# Patient Record
Sex: Female | Born: 1990 | Race: Black or African American | Hispanic: No | Marital: Single | Smoking: Never smoker
Health system: Southern US, Community
[De-identification: ages and names within clinical notes are randomized; demographics above are authoritative.]

## PROBLEM LIST (undated history)

## (undated) HISTORY — PX: OTHER SURGICAL HISTORY: SHX169

---
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
## 2012-05-22 ENCOUNTER — Emergency Department (HOSPITAL_COMMUNITY)
Admission: EM | Admit: 2012-05-22 | Discharge: 2012-05-22 | Disposition: A | Discharge status: Home / Self Care | Payer: Self-pay | Attending: Emergency Medicine | Admitting: Emergency Medicine

## 2012-05-22 ENCOUNTER — Encounter (HOSPITAL_COMMUNITY): Payer: Self-pay | Admitting: Emergency Medicine

## 2012-05-22 DIAGNOSIS — R064 Hyperventilation: Secondary | ICD-10-CM | POA: Insufficient documentation

## 2012-05-22 DIAGNOSIS — R51 Headache: Secondary | ICD-10-CM | POA: Insufficient documentation
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
## 2012-05-26 NOTE — ED Provider Notes (Signed)
I saw and evaluated the patient, reviewed the resident's note and I agree with the findings and plan.  Toy Baker, MD 05/26/12 [phone]

## 2013-08-07 ENCOUNTER — Encounter (HOSPITAL_COMMUNITY): Payer: Self-pay | Admitting: Emergency Medicine

## 2013-08-07 DIAGNOSIS — R51 Headache: Secondary | ICD-10-CM | POA: Insufficient documentation

## 2013-08-07 NOTE — ED Notes (Signed)
Pt. reports headache onset this afternoon , denies injury , alert and oriented ,respirations unlabored / ambulatory.

## 2013-08-08 ENCOUNTER — Emergency Department (HOSPITAL_COMMUNITY)
Admission: EM | Admit: 2013-08-08 | Discharge: 2013-08-08 | Discharge status: Against Medical Advice | Payer: Medicaid Other | Attending: Emergency Medicine | Admitting: Emergency Medicine

## 2013-08-08 NOTE — ED Notes (Signed)
Name called no answer 

## 2014-01-17 ENCOUNTER — Encounter (HOSPITAL_COMMUNITY): Payer: Self-pay

## 2015-07-20 ENCOUNTER — Encounter (HOSPITAL_COMMUNITY): Payer: Self-pay | Admitting: Emergency Medicine

## 2015-07-20 ENCOUNTER — Emergency Department (HOSPITAL_COMMUNITY)
Admission: EM | Admit: 2015-07-20 | Discharge: 2015-07-20 | Disposition: A | Discharge status: Home / Self Care | Payer: No Typology Code available for payment source | Attending: Emergency Medicine | Admitting: Emergency Medicine

## 2015-07-20 DIAGNOSIS — R509 Fever, unspecified: Secondary | ICD-10-CM

## 2015-07-20 DIAGNOSIS — J029 Acute pharyngitis, unspecified: Secondary | ICD-10-CM

## 2015-07-20 LAB — RAPID STREP SCREEN (MED CTR MEBANE ONLY): STREPTOCOCCUS, GROUP A SCREEN (DIRECT): NEGATIVE

## 2015-07-20 MED ORDER — IBUPROFEN 800 MG PO TABS: 800.0000 mg | ORAL_TABLET | Freq: Three times a day (TID) | ORAL | Status: DC

## 2015-07-20 MED ORDER — LIDOCAINE VISCOUS 2 % MT SOLN
15.0000 mL | Freq: Once | OROMUCOSAL | Status: AC
  Administered 2015-07-20: 15 mL via OROMUCOSAL
  Filled 2015-07-20: qty 15

## 2015-07-20 MED ORDER — ACETAMINOPHEN 325 MG PO TABS
650.0000 mg | ORAL_TABLET | Freq: Once | ORAL | Status: AC
  Administered 2015-07-20: 650 mg via ORAL
  Filled 2015-07-20: qty 2

## 2015-07-20 MED ORDER — IBUPROFEN 800 MG PO TABS
800.0000 mg | ORAL_TABLET | Freq: Once | ORAL | Status: AC
Start: 2015-07-20 — End: 2015-07-20
  Administered 2015-07-20: 800 mg via ORAL
  Filled 2015-07-20: qty 1

## 2015-07-20 MED ORDER — DEXAMETHASONE SODIUM PHOSPHATE 10 MG/ML IJ SOLN
10.0000 mg | Freq: Once | INTRAMUSCULAR | Status: AC
  Administered 2015-07-20: 10 mg via INTRAMUSCULAR
  Filled 2015-07-20: qty 1

## 2015-07-20 MED ORDER — LIDOCAINE VISCOUS 2 % MT SOLN: 20.0000 mL | OROMUCOSAL | Status: DC | PRN

## 2015-07-20 NOTE — ED Notes (Signed)
Kayla, PA made aware of pt's temperature and HR.

## 2015-07-20 NOTE — ED Notes (Signed)
Pt reports cold symptoms , chills, sore throat , dry cough x 3 days. Pt has fever 101.2. denies abd pain nor nausea today. Alert and oriented x 4. No obvious distress.

## 2015-07-20 NOTE — Discharge Instructions (Signed)

## 2015-07-20 NOTE — ED Provider Notes (Signed)
CSN: [id number]     Arrival date & time 07/20/15  1353 History  By signing my name below, I, [facility], attest that this documentation has been prepared under the direction and in the presence of Cheri Fowler, PA-C. Electronically Signed: Randell Patient, ED Scribe. 07/20/2015. 4:13 PM.   Chief Complaint  Patient presents with  . Sore Throat   The history is provided by the patient. No language interpreter was used.   HPI Comments: Julie Nguyen is a 25 y.o. female with no pertinent chronic conditions who presents to the Emergency Department complaining of a constant, mild, unchanging sore throat onset 3 days ago.  She endorses associated chills, fevers at home with a TMAX 101.2 today in the ED, generalized body aches, vomiting once, fever, rhinorrhea, and a dry, nonproductive cough. Per patient, she has been eating and drinking normally but states that everything tastes awful. She has taken OTC pain medication and cough drops without relief. She denies recent sick contacts with individuals with similar symptoms. Patient denies nausea, neck stiffness, and ear pain.  History reviewed. No pertinent past medical history. Past Surgical History  Procedure Laterality Date  . Never     No family history on file. Social History  Substance Use Topics  . Smoking status: Never Smoker   . Smokeless tobacco: Never Used  . Alcohol Use: No   OB History    No data available     Review of Systems All other systems negative except as noted in HPI.   Allergies  Review of patient's allergies indicates no known allergies.  Home Medications   Prior to Admission medications   Medication Sig Start Date End Date Taking? Authorizing Provider  ibuprofen (ADVIL,MOTRIN) 800 MG tablet Take 1 tablet (800 mg total) by mouth 3 (three) times daily. 07/20/15   Cheri Fowler, PA-C  lidocaine (XYLOCAINE) 2 % solution Use as directed 20 mLs in the mouth or throat as needed for mouth pain. 07/20/15    Tanya Crothers, PA-C   BP 114/61 mmHg  Pulse 99  Temp(Src) 101.4 F (38.6 C) (Oral)  Resp 17  SpO2 100%  LMP 07/17/2015 Physical Exam  Constitutional: She is oriented to person, place, and time. She appears well-developed and well-nourished.  Non-toxic appearance. She does not have a sickly appearance. She does not appear ill.  HENT:  Head: Normocephalic and atraumatic.  Right Ear: External ear normal.  Left Ear: External ear normal.  Mouth/Throat: Uvula is midline and mucous membranes are normal. No trismus in the jaw. No uvula swelling. Posterior oropharyngeal erythema present. No oropharyngeal exudate, posterior oropharyngeal edema or tonsillar abscesses.  Eyes: Conjunctivae are normal. Pupils are equal, round, and reactive to light. No scleral icterus.  Neck: Normal range of motion. Neck supple. No tracheal deviation present.  Cardiovascular: Normal rate, regular rhythm and normal heart sounds.   No murmur heard. Pulmonary/Chest: Effort normal and breath sounds normal. No accessory muscle usage or stridor. No respiratory distress. She has no wheezes. She has no rhonchi. She has no rales.  Abdominal: Soft. Bowel sounds are normal. She exhibits no distension. There is no tenderness.  Musculoskeletal: Normal range of motion.  Lymphadenopathy:    She has no cervical adenopathy.  Neurological: She is alert and oriented to person, place, and time.  Speech clear without dysarthria.  Skin: Skin is warm and dry.  Psychiatric: She has a normal mood and affect. Her behavior is normal.    ED Course  Procedures   DIAGNOSTIC STUDIES:  Oxygen Saturation is 99% on RA, normal by my interpretation.    COORDINATION OF CARE: 2:23 PM Will provide pt with fluids. Will order Decadron, ibuprofen, Xylocain, and strep test. Discussed treatment plan with pt at bedside and pt agreed to plan.  Labs Review Labs Reviewed  RAPID STREP SCREEN (NOT AT [facility])  CULTURE, GROUP A STREP [facility])    Imaging  Review No results found. I have personally reviewed and evaluated these images and lab results as part of my medical decision-making.   EKG Interpretation None      MDM   Final diagnoses:  Pharyngitis  Fever, unspecified fever cause    Findings consistent with viral pharyngitis.  Blood pressure 114/61, pulse 99, temperature 101.4 F (38.6 C), temperature source Oral, resp. rate 17, last menstrual period 07/17/2015, SpO2 100 %. Initially tachycardic at 115, which has resolved with PO fluids.  Temp 101.4.  Patient has received ibuprofen and tylenol in ED.  Plan to discharge home with viscous lidocaine and ibuprofen.  Encouraged PO fluids.  Discussed return precautions.  Patient agrees and acknowledges the above plan for discharge.    I personally performed the services described in this documentation, which was scribed in my presence. The recorded information has been reviewed and is accurate.   Cheri Fowler, PA-C 07/20/15 1613  Richardean Canal, MD 07/21/15 [phone]

## 2015-07-23 LAB — CULTURE, GROUP A STREP (THRC)

## 2016-05-26 ENCOUNTER — Emergency Department (HOSPITAL_COMMUNITY): Payer: No Typology Code available for payment source

## 2016-05-26 ENCOUNTER — Emergency Department (HOSPITAL_COMMUNITY)
Admission: EM | Admit: 2016-05-26 | Discharge: 2016-05-26 | Disposition: A | Discharge status: Home / Self Care | Payer: No Typology Code available for payment source | Attending: Emergency Medicine | Admitting: Emergency Medicine

## 2016-05-26 ENCOUNTER — Encounter (HOSPITAL_COMMUNITY): Payer: Self-pay | Admitting: Emergency Medicine

## 2016-05-26 DIAGNOSIS — S3992XA Unspecified injury of lower back, initial encounter: Secondary | ICD-10-CM | POA: Insufficient documentation

## 2016-05-26 DIAGNOSIS — Y92410 Unspecified street and highway as the place of occurrence of the external cause: Secondary | ICD-10-CM | POA: Insufficient documentation

## 2016-05-26 DIAGNOSIS — S0990XA Unspecified injury of head, initial encounter: Secondary | ICD-10-CM | POA: Diagnosis present

## 2016-05-26 DIAGNOSIS — Y939 Activity, unspecified: Secondary | ICD-10-CM | POA: Insufficient documentation

## 2016-05-26 DIAGNOSIS — Y999 Unspecified external cause status: Secondary | ICD-10-CM | POA: Insufficient documentation

## 2016-05-26 LAB — POC URINE PREG, ED: PREG TEST UR: NEGATIVE

## 2016-05-26 MED ORDER — DICLOFENAC SODIUM 50 MG PO TBEC: 50.0000 mg | DELAYED_RELEASE_TABLET | Freq: Two times a day (BID) | ORAL | 0 refills | Status: DC

## 2016-05-26 MED ORDER — METHOCARBAMOL 500 MG PO TABS: 500.0000 mg | ORAL_TABLET | Freq: Two times a day (BID) | ORAL | 0 refills | Status: DC
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
## 2016-05-26 NOTE — ED Notes (Signed)
Pt verbalized understanding of d/c instructions and has no further questions. Pt is stable, A&Ox4, VSS.  

## 2016-05-26 NOTE — ED Notes (Signed)
[place]
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
## 2016-05-26 NOTE — Discharge Instructions (Signed)
Your CT scan did show possible Mucous retention cyst of the ethmoid sinus. You should follow up with Dr. Jenne PaneBates for further evaluation.

## 2016-08-19 ENCOUNTER — Ambulatory Visit: Payer: Self-pay | Attending: Family Medicine | Admitting: Family Medicine

## 2016-08-19 VITALS — BP 97/64 | HR 92 | Temp 98.8°F | Resp 18 | Ht 62.0 in | Wt 92.4 lb

## 2016-08-19 DIAGNOSIS — Z975 Presence of (intrauterine) contraceptive device: Secondary | ICD-10-CM

## 2016-08-19 DIAGNOSIS — Z793 Long term (current) use of hormonal contraceptives: Secondary | ICD-10-CM | POA: Insufficient documentation

## 2016-08-19 DIAGNOSIS — N920 Excessive and frequent menstruation with regular cycle: Secondary | ICD-10-CM | POA: Insufficient documentation

## 2016-08-19 LAB — CBC WITH DIFFERENTIAL/PLATELET
Basophils Absolute: 61 cells/uL (ref 0–200)
Basophils Relative: 1 %
EOS ABS: 122 {cells}/uL (ref 15–500)
EOS PCT: 2 %
HCT: 39.8 % (ref 35.0–45.0)
HEMOGLOBIN: 13.2 g/dL (ref 11.7–15.5)
Lymphocytes Relative: 47 %
Lymphs Abs: 2867 cells/uL (ref 850–3900)
MCH: 30 pg (ref 27.0–33.0)
MCHC: 33.2 g/dL (ref 32.0–36.0)
MCV: 90.5 fL (ref 80.0–100.0)
MONO ABS: 366 {cells}/uL (ref 200–950)
MPV: 10 fL (ref 7.5–12.5)
Monocytes Relative: 6 %
NEUTROS ABS: 2684 {cells}/uL (ref 1500–7800)
Neutrophils Relative %: 44 %
Platelets: 335 10*3/uL (ref 140–400)
RBC: 4.4 MIL/uL (ref 3.80–5.10)
RDW: 14.2 % (ref 11.0–15.0)
WBC: 6.1 10*3/uL (ref 3.8–10.8)

## 2016-08-19 LAB — POCT URINE PREGNANCY: Preg Test, Ur: NEGATIVE

## 2016-08-19 NOTE — Progress Notes (Signed)
Patient is here for birthcontrol Implanon remove   Implanon expire on 06/07/2013  Patient denies pain today  Patient is not on any current med  Patient has eaten today

## 2016-08-19 NOTE — Progress Notes (Signed)
   Subjective:  Patient ID: Julie Nguyen, female    DOB: 12/26/1990  Age: 26 y.o. MRN: [id number]  CC: No chief complaint on file.   HPI Julie Nguyen presents for   History of Nexplanon implant: Reports placement Dec. 2011.Denies any pain, paresthesias, or swelling of the arm at the implant site. Reports menstrual cycles returned in 2015. LPM, beginning of last month unsure of day. Menstrual cycles last 1 to 2 weeks or more with heavy menstrual periods .   Outpatient Medications Prior to Visit  Medication Sig Dispense Refill  . diclofenac (VOLTAREN) 50 MG EC tablet Take 1 tablet (50 mg total) by mouth 2 (two) times daily. 15 tablet 0  . ibuprofen (ADVIL,MOTRIN) 800 MG tablet Take 1 tablet (800 mg total) by mouth 3 (three) times daily. 21 tablet 0  . lidocaine (XYLOCAINE) 2 % solution Use as directed 20 mLs in the mouth or throat as needed for mouth pain. 100 mL 0  . methocarbamol (ROBAXIN) 500 MG tablet Take 1 tablet (500 mg total) by mouth 2 (two) times daily. 20 tablet 0   No facility-administered medications prior to visit.     ROS Review of Systems  Respiratory: Negative.   Cardiovascular: Negative.   Genitourinary: Positive for menstrual problem.  Skin: Negative.     Objective:  BP 97/64 (BP Location: Left Arm, Patient Position: Sitting, Cuff Size: Normal)   Pulse 92   Temp 98.8 F (37.1 C) (Oral)   Resp 18   Ht 5\' 2"  (1.575 m)   Wt 92 lb 6.4 oz (41.9 kg)   LMP 07/09/2016   SpO2 97%   BMI 16.90 kg/m   BP/Weight 08/19/2016 05/26/2016 07/20/2015  Systolic BP 97 113 114  Diastolic BP 64 77 61  Wt. (Lbs) 92.4 100 -  BMI 16.9 18.29 -    Physical Exam  Cardiovascular: Normal rate, regular rhythm, normal heart sounds and intact distal pulses.   Pulmonary/Chest: Effort normal and breath sounds normal.  Abdominal: Soft. Bowel sounds are normal.  Skin: Skin is warm and dry.  Nexplanon to left, upper, medial forearm.   Nursing note and vitals  reviewed.   Assessment & Plan:   Problem List Items Addressed This Visit    None    Visit Diagnoses    Nexplanon in place    -  Primary   Relevant Orders   POCT urine pregnancy (Completed)   Ambulatory referral to Gynecology   Menorrhagia with regular cycle       Relevant Orders   CBC with Differential (Completed)        Follow-up: Return in about 2 weeks (around 09/02/2016) for Routine PAP.   Lizbeth BarkMandesia R Hairston FNP

## 2016-08-19 NOTE — Patient Instructions (Signed)
Menorrhagia Menorrhagia is when your menstrual periods are heavy or last longer than usual. Follow these instructions at home:  Only take medicine as told by your doctor.  Take any iron pills as told by your doctor. Heavy bleeding may cause low levels of iron in your body.  Do not take aspirin 1 week before or during your period. Aspirin can make the bleeding worse.  Lie down for a while if you change your tampon or pad more than once in 2 hours. This may help lessen the bleeding.  Eat a healthy diet and foods with iron. These foods include leafy green vegetables, meat, liver, eggs, and whole grain breads and cereals.  Do not try to lose weight. Wait until the heavy bleeding has stopped and your iron level is normal. Contact a doctor if:  You soak through a pad or tampon every 1 or 2 hours, and this happens every time you have a period.  You need to use pads and tampons at the same time because you are bleeding so much.  You need to change your pad or tampon during the night.  You have a period that lasts for more than 8 days.  You pass clots bigger than 1 inch (2.5 cm) wide.  You have irregular periods that happen more or less often than once a month.  You feel dizzy or pass out (faint).  You feel very weak or tired.  You feel short of breath or feel your heart is beating too fast when you exercise.  You feel sick to your stomach (nausea) and you throw up (vomit) while you are taking your medicine.  You have watery poop (diarrhea) while you are taking your medicine.  You have any problems that may be related to the medicine you are taking. Get help right away if:  You soak through 4 or more pads or tampons in 2 hours.  You have any bleeding while you are pregnant. This information is not intended to replace advice given to you by your health care provider. Make sure you discuss any questions you have with your health care provider. Document Released: 03/03/2008 Document  Revised: 10/31/2015 Document Reviewed: 11/24/2012 Elsevier Interactive Patient Education  2017 Elsevier Inc.  

## 2016-08-21 ENCOUNTER — Telehealth: Payer: Self-pay | Admitting: Family Medicine

## 2016-08-21 ENCOUNTER — Telehealth: Payer: Self-pay

## 2016-08-21 NOTE — Telephone Encounter (Signed)
Pt returning call to discuss lab results  °

## 2016-08-21 NOTE — Telephone Encounter (Signed)
CMA call to inform patient about results  Patient Verify DOB  Patient was aware and understood   

## 2016-08-21 NOTE — Telephone Encounter (Signed)
-----   Message from Lizbeth BarkMandesia R Hairston, FNP sent at 08/20/2016 12:58 PM EDT ----- Complete blood count lab is normal. No signs of anemia or infection.

## 2016-08-21 NOTE — Telephone Encounter (Signed)
CMA call to go  Over lab results  Patient did not answer but CMA left a VM stating the reason of the call & to call me back

## 2016-09-03 ENCOUNTER — Other Ambulatory Visit (HOSPITAL_COMMUNITY)
Admission: RE | Admit: 2016-09-03 | Discharge: 2016-09-03 | Disposition: A | Discharge status: Home / Self Care | Payer: Medicaid Other | Source: Ambulatory Visit | Attending: Family Medicine | Admitting: Family Medicine

## 2016-09-03 ENCOUNTER — Other Ambulatory Visit: Payer: Medicaid Other | Admitting: Family Medicine

## 2016-09-03 ENCOUNTER — Ambulatory Visit: Payer: Self-pay | Attending: Family Medicine | Admitting: Family Medicine

## 2016-09-03 ENCOUNTER — Encounter: Payer: Self-pay | Admitting: Family Medicine

## 2016-09-03 VITALS — BP 99/65 | HR 70 | Temp 98.5°F | Resp 18 | Ht 62.0 in | Wt 89.8 lb

## 2016-09-03 DIAGNOSIS — N3001 Acute cystitis with hematuria: Secondary | ICD-10-CM | POA: Insufficient documentation

## 2016-09-03 DIAGNOSIS — Z113 Encounter for screening for infections with a predominantly sexual mode of transmission: Secondary | ICD-10-CM | POA: Insufficient documentation

## 2016-09-03 DIAGNOSIS — Z01419 Encounter for gynecological examination (general) (routine) without abnormal findings: Secondary | ICD-10-CM | POA: Insufficient documentation

## 2016-09-03 DIAGNOSIS — R350 Frequency of micturition: Secondary | ICD-10-CM

## 2016-09-03 MED ORDER — CIPROFLOXACIN HCL 250 MG PO TABS: 250.0000 mg | ORAL_TABLET | Freq: Two times a day (BID) | ORAL | 0 refills | Status: DC

## 2016-09-03 NOTE — Patient Instructions (Addendum)
Schedule appointment with provider can who perform Nexplanon removal.  Get financial paperwork to and apply for orange card.   Urinary Tract Infection, Adult A urinary tract infection (UTI) is an infection of any part of the urinary tract. The urinary tract includes the:  Kidneys.  Ureters.  Bladder.  Urethra. These organs make, store, and get rid of pee (urine) in the body. Follow these instructions at home:  Take over-the-counter and prescription medicines only as told by your doctor.  If you were prescribed an antibiotic medicine, take it as told by your doctor. Do not stop taking the antibiotic even if you start to feel better.  Avoid the following drinks:  Alcohol.  Caffeine.  Tea.  Carbonated drinks.  Drink enough fluid to keep your pee clear or pale yellow.  Keep all follow-up visits as told by your doctor. This is important.  Make sure to:  Empty your bladder often and completely. Do not to hold pee for long periods of time.  Empty your bladder before and after sex.  Wipe from front to back after a bowel movement if you are female. Use each tissue one time when you wipe. Contact a doctor if:  You have back pain.  You have a fever.  You feel sick to your stomach (nauseous).  You throw up (vomit).  Your symptoms do not get better after 3 days.  Your symptoms go away and then come back. Get help right away if:  You have very bad back pain.  You have very bad lower belly (abdominal) pain.  You are throwing up and cannot keep down any medicines or water. This information is not intended to replace advice given to you by your health care provider. Make sure you discuss any questions you have with your health care provider. Document Released: 11/11/2007 Document Revised: 10/31/2015 Document Reviewed: 04/15/2015 Elsevier Interactive Patient Education  2017 Elsevier Inc.  Pap Test Why am I having this test? A pap test is sometimes called a pap smear.  It is a screening test that is used to check for signs of cancer of the vagina, cervix, and uterus. The test can also identify the presence of infection or precancerous changes. Your health care provider will likely recommend you have this test done on a regular basis. This test may be done:  Every 3 years, starting at age 23.  Every 5 years, in combination with testing for the presence of human papillomavirus (HPV).  More or less often depending on other medical conditions. What kind of sample is taken? Using a small cotton swab, plastic spatula, or brush, your health care provider will collect a sample of cells from the surface of your cervix. Your cervix is the opening to your uterus, also called a womb. Secretions from the cervix and vagina may also be collected. How do I prepare for this test?  Be aware of where you are in your menstrual cycle. You may be asked to reschedule the test if you are menstruating on the day of the test.  You may need to reschedule if you have a known vaginal infection on the day of the test.  You may be asked to avoid douching or taking a bath the day before or the day of the test.  Some medicines can cause abnormal test results, such as digitalis and tetracycline. Talk with your health care provider before your test if you take one of these medicines. What do the results mean? Abnormal test results may indicate  a number of health conditions. These may include:  Cancer. Although pap test results cannot be used to diagnose cancer of the cervix, vagina, or uterus, they may suggest the possibility of cancer. Further tests would be required to determine if cancer is present.  Sexually transmitted disease.  Fungal infection.  Parasite infection.  Herpes infection.  A condition causing or contributing to infertility. It is your responsibility to obtain your test results. Ask the lab or department performing the test when and how you will get your results.  Contact your health care provider to discuss any questions you have about your results. Talk with your health care provider to discuss your results, treatment options, and if necessary, the need for more tests. Talk with your health care provider if you have any questions about your results. This information is not intended to replace advice given to you by your health care provider. Make sure you discuss any questions you have with your health care provider. Document Released: 08/15/2002 Document Revised: 01/29/2016 Document Reviewed: 10/16/2013 Elsevier Interactive Patient Education  2017 [employer]

## 2016-09-03 NOTE — Progress Notes (Signed)
Subjective:  Patient ID: Julie Nguyen, female    DOB: Aug 18, 1990  Age: 26 y.o. MRN: [id number]  CC: Gynecologic Exam   HPI Julie Nguyen presents for  Well woman visit with routine pap examination: Denies any vaginal discharge or dysuria. Reports occasional skin bumps most recent episode was last month. Reports inc.urinary frequency for two days. Denies any no cloudy colored urine or foul smelling. Currently sexually active. Reports 1 sexual partner within the last 3 months, unprotected. Currently on menstrual cycle. History of Nexplanon. Denies any family breast cancer. Denies monthly SBE. She denies denting,  dimpling, or lumps of the breast, or nipple discharge. She is a current smoker 1 to 2 "black and mild's" cigars per day.  u Outpatient Medications Prior to Visit  Medication Sig Dispense Refill  . diclofenac (VOLTAREN) 50 MG EC tablet Take 1 tablet (50 mg total) by mouth 2 (two) times daily. 15 tablet 0  . ibuprofen (ADVIL,MOTRIN) 800 MG tablet Take 1 tablet (800 mg total) by mouth 3 (three) times daily. 21 tablet 0  . lidocaine (XYLOCAINE) 2 % solution Use as directed 20 mLs in the mouth or throat as needed for mouth pain. 100 mL 0  . methocarbamol (ROBAXIN) 500 MG tablet Take 1 tablet (500 mg total) by mouth 2 (two) times daily. 20 tablet 0   No facility-administered medications prior to visit.     ROS Review of Systems  Respiratory: Negative.   Cardiovascular: Negative.   Gastrointestinal: Negative.   Genitourinary: Positive for frequency.  Skin: Negative.     Objective:  BP 99/65 (BP Location: Left Arm, Patient Position: Sitting, Cuff Size: Normal)   Pulse 70   Temp 98.5 F (36.9 C) (Oral)   Resp 18   Ht 5\' 2"  (1.575 m)   Wt 89 lb 12.8 oz (40.7 kg)   SpO2 100%   BMI 16.42 kg/m   BP/Weight 09/03/2016 08/19/2016 05/26/2016  Systolic BP 99 97 113  Diastolic BP 65 64 77  Wt. (Lbs) 89.8 92.4 100  BMI 16.42 16.9 18.29   Physical Exam  Cardiovascular:  Normal rate, regular rhythm, normal heart sounds and intact distal pulses.   Pulmonary/Chest: Effort normal and breath sounds normal. Right breast exhibits no inverted nipple, no mass, no nipple discharge and no skin change. Left breast exhibits no inverted nipple, no mass, no nipple discharge and no skin change.  Abdominal: Soft. Bowel sounds are normal.  Genitourinary: No breast discharge. Cervix exhibits discharge (menstrual bleeding).  Skin: Skin is warm and dry.  Nursing note and vitals reviewed.   Assessment & Plan:   Problem List Items Addressed This Visit    None    Visit Diagnoses    Encounter for well woman exam with routine gynecological exam    -  Primary   Relevant Orders   Cytology - PAP [place] (Completed)   HEP, RPR, HIV Panel (Completed)   Acute cystitis with hematuria       Relevant Medications   ciprofloxacin (CIPRO) 250 MG tablet   Screening examination for STD (sexually transmitted disease)       Relevant Orders   Cytology - PAP [place] (Completed)   HEP, RPR, HIV Panel (Completed)   Frequent urination       Relevant Orders   Urinalysis Dipstick      Meds ordered this encounter  Medications  . ciprofloxacin (CIPRO) 250 MG tablet    Sig: Take 1 tablet (250 mg total) by mouth 2 (  two) times daily.    Dispense:  6 tablet    Refill:  0    Order Specific Question:   Supervising Provider    Answer:   Quentin AngstJEGEDE, OLUGBEMIGA E [id number]    Follow-up: Return f symptoms worsen or fail to improve. Return in about 3 years (around 09/04/2019),for Routine pap .   Lizbeth BarkMandesia R Ayvin Lipinski FNP

## 2016-09-03 NOTE — Progress Notes (Signed)
Patient is here for PAP   Patient is not taking any current meds   Patient has not eaten   Patient denies pain for today  Patient has no concerns

## 2016-09-04 LAB — CERVICOVAGINAL ANCILLARY ONLY
BACTERIAL VAGINITIS: POSITIVE — AB
CANDIDA VAGINITIS: NEGATIVE
Chlamydia: POSITIVE — AB
Neisseria Gonorrhea: NEGATIVE
TRICH (WINDOWPATH): NEGATIVE

## 2016-09-04 LAB — HEP, RPR, HIV PANEL
HIV SCREEN 4TH GENERATION: NONREACTIVE
Hepatitis B Surface Ag: NEGATIVE
RPR: NONREACTIVE

## 2016-09-07 ENCOUNTER — Other Ambulatory Visit: Payer: Self-pay | Admitting: Family Medicine

## 2016-09-07 ENCOUNTER — Telehealth: Payer: Self-pay

## 2016-09-07 DIAGNOSIS — A749 Chlamydial infection, unspecified: Secondary | ICD-10-CM

## 2016-09-07 DIAGNOSIS — B9689 Other specified bacterial agents as the cause of diseases classified elsewhere: Secondary | ICD-10-CM

## 2016-09-07 DIAGNOSIS — N760 Acute vaginitis: Secondary | ICD-10-CM

## 2016-09-07 LAB — CYTOLOGY - PAP
Adequacy: ABSENT
Diagnosis: NEGATIVE

## 2016-09-07 LAB — CERVICOVAGINAL ANCILLARY ONLY: Herpes: NEGATIVE

## 2016-09-07 MED ORDER — AZITHROMYCIN 250 MG PO TABS: 1000.0000 mg | ORAL_TABLET | Freq: Once | ORAL | 0 refills | Status: AC

## 2016-09-07 MED ORDER — METRONIDAZOLE 500 MG PO TABS: 500.0000 mg | ORAL_TABLET | Freq: Two times a day (BID) | ORAL | 0 refills | Status: DC

## 2016-09-07 NOTE — Telephone Encounter (Signed)
CMA call to inform patient about results   Patient did not answer boyfriend did so I left a message with him to please call the office

## 2016-09-07 NOTE — Telephone Encounter (Signed)
-----   Message from Lizbeth Bark, FNP sent at 09/07/2016  3:16 PM EDT ----- Pap smear showed no lesions or malignancy.
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
## 2016-09-07 NOTE — Telephone Encounter (Signed)
-----   Message from Lizbeth Bark, FNP sent at 09/07/2016 12:21 PM EDT ----- -Hepatitis, Syphilis, HIV were all negative.  - Chlamydia was positive. You will be prescribed azithromycin to treat. Any recent sexual partner you've had will need to be treated also.  -Herpes, Gonorrhea, , BV, Yeast, and Trichomonas were all negative.  -Bacterial vaginosis was positive. BV is caused by an overgrowth of germs in the vagina. To reduce your risk of developing BV don't douche, don't use scented soap or sprays, and use protection during sexual intercourse. You will be presribed metronidazole to treat. Do not drink alcohol with this medication.  -You will be notified of pap smear results when they're available.

## 2016-09-07 NOTE — Telephone Encounter (Signed)
CMA call to inform patient results  Patient Verify DOB  Patient was aware and understood

## 2016-09-07 NOTE — Telephone Encounter (Signed)
Pt. Returned nurse call regarding results. Please f/u  °

## 2016-09-10 ENCOUNTER — Ambulatory Visit: Payer: Medicaid Other

## 2016-09-22 MED FILL — CIPROFLOXACIN HCL 250 MG TA: 250 | 3 days supply | Qty: 6 | Fill #0

## 2016-09-22 MED FILL — metroNIDAZOLE 500 MG TABS: 500 | 7 days supply | Qty: 14 | Fill #0

## 2016-09-22 MED FILL — AZITHROMYCIN 250 MG TABLET: 250 | 1 days supply | Qty: 4 | Fill #0

## 2016-09-29 ENCOUNTER — Other Ambulatory Visit: Payer: Self-pay | Admitting: Family Medicine

## 2016-09-29 ENCOUNTER — Telehealth: Payer: Self-pay | Admitting: Family Medicine

## 2016-09-29 DIAGNOSIS — A749 Chlamydial infection, unspecified: Secondary | ICD-10-CM

## 2016-09-29 DIAGNOSIS — N760 Acute vaginitis: Principal | ICD-10-CM

## 2016-09-29 DIAGNOSIS — B9689 Other specified bacterial agents as the cause of diseases classified elsewhere: Secondary | ICD-10-CM

## 2016-09-29 MED ORDER — METRONIDAZOLE 500 MG PO TABS: 500.0000 mg | ORAL_TABLET | Freq: Two times a day (BID) | ORAL | 0 refills | Status: DC

## 2016-09-29 MED ORDER — AZITHROMYCIN 250 MG PO TABS: 1000.0000 mg | ORAL_TABLET | Freq: Once | ORAL | 0 refills | Status: AC
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
## 2016-09-30 NOTE — Telephone Encounter (Signed)
CMA call patient regarding medication refill  Patient did not answer but left a VM stating to call back

## 2016-09-30 NOTE — Telephone Encounter (Signed)
CMA second  Call attempt to patient   Patient did not answer but left a VM

## 2016-10-02 NOTE — Telephone Encounter (Signed)
Patient return CMA call  Patient was aware and understood about the RX

## 2016-10-19 ENCOUNTER — Ambulatory Visit: Payer: Medicaid Other | Admitting: Obstetrics and Gynecology

## 2016-10-19 ENCOUNTER — Encounter: Payer: Self-pay | Admitting: Family Medicine

## 2017-01-20 ENCOUNTER — Ambulatory Visit: Payer: Medicaid Other | Admitting: Family Medicine

## 2017-02-11 ENCOUNTER — Ambulatory Visit: Payer: Self-pay | Admitting: Family Medicine

## 2017-02-17 ENCOUNTER — Encounter: Payer: Self-pay | Admitting: Family Medicine

## 2017-02-17 ENCOUNTER — Ambulatory Visit: Payer: Self-pay | Attending: Family Medicine | Admitting: Family Medicine

## 2017-02-17 VITALS — BP 103/69 | HR 73 | Temp 98.7°F | Resp 18 | Ht 62.0 in | Wt 84.6 lb

## 2017-02-17 DIAGNOSIS — Z975 Presence of (intrauterine) contraceptive device: Secondary | ICD-10-CM

## 2017-02-17 DIAGNOSIS — A749 Chlamydial infection, unspecified: Secondary | ICD-10-CM | POA: Insufficient documentation

## 2017-02-17 DIAGNOSIS — N898 Other specified noninflammatory disorders of vagina: Secondary | ICD-10-CM | POA: Insufficient documentation

## 2017-02-17 DIAGNOSIS — Z8619 Personal history of other infectious and parasitic diseases: Secondary | ICD-10-CM | POA: Insufficient documentation

## 2017-02-17 DIAGNOSIS — R399 Unspecified symptoms and signs involving the genitourinary system: Secondary | ICD-10-CM

## 2017-02-17 DIAGNOSIS — R300 Dysuria: Secondary | ICD-10-CM | POA: Insufficient documentation

## 2017-02-17 LAB — POCT URINALYSIS DIPSTICK
BILIRUBIN UA: NEGATIVE
GLUCOSE UA: NEGATIVE
Leukocytes, UA: NEGATIVE
Nitrite, UA: NEGATIVE
PH UA: 6 (ref 5.0–8.0)
Protein, UA: 30
RBC UA: NEGATIVE
Spec Grav, UA: 1.025 (ref 1.010–1.025)
Urobilinogen, UA: 1 E.U./dL

## 2017-02-17 MED ORDER — AZITHROMYCIN 500 MG PO TABS: 1000.0000 mg | ORAL_TABLET | Freq: Once | ORAL | 0 refills | Status: AC

## 2017-02-17 MED ORDER — AZITHROMYCIN 500 MG PO TABS: 1000.0000 mg | ORAL_TABLET | Freq: Once | ORAL | 0 refills | Status: DC

## 2017-02-17 MED FILL — AZITHROMYCIN 500 MG TABLET: 500 | 1 days supply | Qty: 2 | Fill #0

## 2017-02-17 NOTE — Patient Instructions (Signed)

## 2017-02-17 NOTE — Progress Notes (Signed)
   Subjective:  Patient ID: Julie Nguyen, female    DOB: [birth year]  Age: 26 y.o. MRN: [id number]  CC: Dysuria   HPI Julie Nguyen presents for complains of dysuria and vaginal charge. History of chlamydia and BV infection. She reports not completing her antibiotic course. Patient denies fever and pelvic pain. Patient does not have a history of recurrent UTI.  Patient does not have a history of pyelonephritis. History of Nexplanon implant: Reports placement Dec. 2011.Denies any pain, paresthesias, or swelling of the arm at the implant site. Reports menstrual cycles returned in 2015. LPM, beginning of last month unsure of day.   Outpatient Medications Prior to Visit  Medication Sig Dispense Refill  . ciprofloxacin (CIPRO) 250 MG tablet Take 1 tablet (250 mg total) by mouth 2 (two) times daily. 6 tablet 0  . diclofenac (VOLTAREN) 50 MG EC tablet Take 1 tablet (50 mg total) by mouth 2 (two) times daily. 15 tablet 0  . ibuprofen (ADVIL,MOTRIN) 800 MG tablet Take 1 tablet (800 mg total) by mouth 3 (three) times daily. 21 tablet 0  . lidocaine (XYLOCAINE) 2 % solution Use as directed 20 mLs in the mouth or throat as needed for mouth pain. 100 mL 0  . methocarbamol (ROBAXIN) 500 MG tablet Take 1 tablet (500 mg total) by mouth 2 (two) times daily. 20 tablet 0  . metroNIDAZOLE (FLAGYL) 500 MG tablet Take 1 tablet (500 mg total) by mouth 2 (two) times daily. 14 tablet 0   No facility-administered medications prior to visit.     ROS Review of Systems  Constitutional: Negative.   Respiratory: Negative.   Cardiovascular: Negative.   Gastrointestinal: Negative.   Genitourinary: Positive for dysuria.  Skin: Negative.     Objective:  BP 103/69 (BP Location: Left Arm, Patient Position: Sitting, Cuff Size: Normal)   Pulse 73   Temp 98.7 F (37.1 C) (Oral)   Resp 18   Ht 5\' 2"  (1.575 m)   Wt 84 lb 9.6 oz (38.4 kg)   SpO2 96%   BMI 15.47 kg/m   BP/Weight 02/17/2017 09/03/2016  08/19/2016  Systolic BP 103 99 97  Diastolic BP 69 65 64  Wt. (Lbs) 84.6 89.8 92.4  BMI 15.47 16.42 16.9    Physical Exam  Cardiovascular: Normal rate, regular rhythm, normal heart sounds and intact distal pulses.   Pulmonary/Chest: Effort normal and breath sounds normal.  Abdominal: Soft. Bowel sounds are normal. There is no tenderness.  Genitourinary:  Genitourinary Comments: Urine cytology screen  Skin: Skin is warm and dry.  Nexplanon to left, upper, medial forearm.   Nursing note and vitals reviewed.   Assessment & Plan:   1. History of chlamydia infection Patient report not completing treatment for STI. Explained the importance of completing treatment. - azithromycin (ZITHROMAX) 500 MG tablet; Take 2 tablets (1,000 mg total) by mouth once.  Dispense: 2 tablet; Refill: 0 - Urine cytology ancillary only 2. UTI symptoms  - Urinalysis Dipstick - Urine cytology ancillary only  3. Nexplanon in place Referral for nexplanon removal  [facility]  4. Vaginal discharge  - Urine cytology ancillary only     Follow-up: Return if symptoms worsen or fail to improve.   Lizbeth BarkMandesia R Taetum Flewellen FNP

## 2017-02-17 NOTE — Progress Notes (Deleted)
   Subjective:  Patient ID: Julie Nguyen, female    DOB: 1991/05/24  Age: 26 y.o. MRN: [id number]  CC: No chief complaint on file.   HPI Julie Nguyen presents for  Odor  Discharge since  Barriers none   Referral IUD  2011    Outpatient Medications Prior to Visit  Medication Sig Dispense Refill  . ciprofloxacin (CIPRO) 250 MG tablet Take 1 tablet (250 mg total) by mouth 2 (two) times daily. 6 tablet 0  . diclofenac (VOLTAREN) 50 MG EC tablet Take 1 tablet (50 mg total) by mouth 2 (two) times daily. 15 tablet 0  . ibuprofen (ADVIL,MOTRIN) 800 MG tablet Take 1 tablet (800 mg total) by mouth 3 (three) times daily. 21 tablet 0  . lidocaine (XYLOCAINE) 2 % solution Use as directed 20 mLs in the mouth or throat as needed for mouth pain. 100 mL 0  . methocarbamol (ROBAXIN) 500 MG tablet Take 1 tablet (500 mg total) by mouth 2 (two) times daily. 20 tablet 0  . metroNIDAZOLE (FLAGYL) 500 MG tablet Take 1 tablet (500 mg total) by mouth 2 (two) times daily. 14 tablet 0   No facility-administered medications prior to visit.     ROS Review of Systems  Review of Systems - {[id number]}    Objective:  BP 103/69 (BP Location: Left Arm, Patient Position: Sitting, Cuff Size: Normal)   Pulse 73   Temp 98.7 F (37.1 C) (Oral)   Resp 18   Ht 5\' 2"  (1.575 m)   Wt 84 lb 9.6 oz (38.4 kg)   SpO2 96%   BMI 15.47 kg/m   BP/Weight 02/17/2017 09/03/2016 08/19/2016  Systolic BP 103 99 97  Diastolic BP 69 65 64  Wt. (Lbs) 84.6 89.8 92.4  BMI 15.47 16.42 16.9     Physical Exam   Assessment & Plan:   Problem List Items Addressed This Visit    None    Visit Diagnoses    UTI symptoms    -  Primary   Relevant Orders   Urinalysis Dipstick (Completed)      No orders of the defined types were placed in this encounter.   Follow-up: No Follow-up on file.   Lizbeth BarkMandesia R Miho Monda FNP

## 2017-02-17 NOTE — Progress Notes (Signed)
Patient is here for UTI SX 

## 2017-02-18 LAB — URINE CYTOLOGY ANCILLARY ONLY
CHLAMYDIA, DNA PROBE: NEGATIVE
Neisseria Gonorrhea: NEGATIVE
TRICH (WINDOWPATH): NEGATIVE

## 2017-02-19 LAB — URINE CYTOLOGY ANCILLARY ONLY: Candida vaginitis: NEGATIVE

## 2017-02-23 ENCOUNTER — Other Ambulatory Visit: Payer: Self-pay | Admitting: Family Medicine

## 2017-02-23 DIAGNOSIS — B9689 Other specified bacterial agents as the cause of diseases classified elsewhere: Secondary | ICD-10-CM

## 2017-02-23 DIAGNOSIS — N760 Acute vaginitis: Principal | ICD-10-CM

## 2017-02-23 MED ORDER — METRONIDAZOLE 500 MG PO TABS: 500.0000 mg | ORAL_TABLET | Freq: Two times a day (BID) | ORAL | 0 refills | Status: DC

## 2017-02-24 ENCOUNTER — Telehealth: Payer: Self-pay

## 2017-02-24 NOTE — Telephone Encounter (Signed)
CMA call regarding lab results   Patient verify DOB  Patient was aware and understood  

## 2017-02-24 NOTE — Telephone Encounter (Signed)
-----   Message from Lizbeth Bark, FNP sent at 02/23/2017  3:55 PM EDT ----- Chlamydia and Gonorrhea,Yeast, and Trichomonas were all negative.  Bacterial vaginosis was positive. BV is caused by an overgrowth of germs in the vagina. You will be prescribed metronidazole to treat. To reduce your risk of developing BV don't douche, don't use scented soap or sprays, and use protection during sexual intercourse.

## 2017-08-23 ENCOUNTER — Ambulatory Visit: Payer: Self-pay | Attending: Nurse Practitioner | Admitting: Nurse Practitioner

## 2017-08-23 ENCOUNTER — Encounter: Payer: Self-pay | Admitting: Nurse Practitioner

## 2017-08-23 ENCOUNTER — Telehealth: Payer: Self-pay | Admitting: Family Medicine

## 2017-08-23 VITALS — BP 120/81 | HR 103 | Temp 98.1°F | Ht 62.0 in | Wt 81.0 lb

## 2017-08-23 DIAGNOSIS — N912 Amenorrhea, unspecified: Secondary | ICD-10-CM

## 2017-08-23 LAB — POCT URINE PREGNANCY: PREG TEST UR: NEGATIVE

## 2017-08-23 NOTE — Progress Notes (Signed)
Assessment & Plan:  Julie Nguyen was seen today for establish care and possible pregnancy.  Diagnoses and all orders for this visit:  Amenorrhea -     POCT urine pregnancy -     hCG, serum, qualitative    Patient has been counseled on age-appropriate routine health concerns for screening and prevention. These are reviewed and up-to-date. Referrals have been placed accordingly. Immunizations are up-to-date or declined.    Subjective:   Chief Complaint  Patient presents with  . Establish Care    Pt's is here to [facility] care.  Julie Nguyen Possible Pregnancy    Pt's stated she was supposed to have her period last Monday but had bleeding on Saturday and wanted to confirm her pregnancy test.    HPI Julie Nguyen 27 y.o. female presents to office today to establish care and with concerns of amenorrhea and possible pregnancy.    Amenorrhea She denies any previous history of irregular menstrual cycles. Her last NORMAL menstrual cycle was 07-19-2017. She had menstrual bleeding 3 days ago however the bleeding was not similar to her previous menstrual cycles. She also states her cycles are usually last one week however the bleeding which occurred 3 days ago has since stopped. She took a home pregnancy test (clear blue easy) which showed positive.  She does endorse unprotected sex over the past 4-6 weeks. She denies any high risk sexual activity.    Review of Systems  Constitutional: Negative for fever, malaise/fatigue and weight loss.  Eyes: Negative.  Negative for blurred vision, double vision and photophobia.  Respiratory: Negative.  Negative for cough and shortness of breath.   Cardiovascular: Negative.  Negative for chest pain, palpitations and leg swelling.  Gastrointestinal: Negative.  Negative for abdominal pain, constipation, diarrhea, heartburn, nausea and vomiting.  Genitourinary: Negative for dysuria, flank pain, frequency, hematuria and urgency.       SEE HPI  Musculoskeletal:  Negative for back pain.  Neurological: Negative.  Negative for dizziness, focal weakness, seizures and headaches.  Psychiatric/Behavioral: Negative.  Negative for suicidal ideas.    History reviewed. No pertinent past medical history.  Past Surgical History:  Procedure Laterality Date  . None      Family History  Problem Relation Age of Onset  . Diabetes Mother   . Diabetes Maternal Grandmother     Social History Reviewed with no changes to be made today.   Outpatient Medications Prior to Visit  Medication Sig Dispense Refill  . ciprofloxacin (CIPRO) 250 MG tablet Take 1 tablet (250 mg total) by mouth 2 (two) times daily. (Patient not taking: Reported on 08/23/2017) 6 tablet 0  . diclofenac (VOLTAREN) 50 MG EC tablet Take 1 tablet (50 mg total) by mouth 2 (two) times daily. (Patient not taking: Reported on 08/23/2017) 15 tablet 0  . ibuprofen (ADVIL,MOTRIN) 800 MG tablet Take 1 tablet (800 mg total) by mouth 3 (three) times daily. (Patient not taking: Reported on 08/23/2017) 21 tablet 0  . lidocaine (XYLOCAINE) 2 % solution Use as directed 20 mLs in the mouth or throat as needed for mouth pain. (Patient not taking: Reported on 08/23/2017) 100 mL 0  . methocarbamol (ROBAXIN) 500 MG tablet Take 1 tablet (500 mg total) by mouth 2 (two) times daily. (Patient not taking: Reported on 08/23/2017) 20 tablet 0  . metroNIDAZOLE (FLAGYL) 500 MG tablet Take 1 tablet (500 mg total) by mouth 2 (two) times daily. (Patient not taking: Reported on 08/23/2017) 14 tablet 0   No facility-administered medications prior  to visit.     No Known Allergies     Objective:    BP 120/81 (BP Location: Left Arm, Patient Position: Sitting, Cuff Size: Normal)   Pulse (!) 103   Temp 98.1 F (36.7 C) (Oral)   Ht 5\' 2"  (1.575 m)   Wt 81 lb (36.7 kg)   LMP 07/19/2017   SpO2 100%   BMI 14.82 kg/m  Wt Readings from Last 3 Encounters:  08/23/17 81 lb (36.7 kg)  02/17/17 84 lb 9.6 oz (38.4 kg)  09/03/16 89 lb  12.8 oz (40.7 kg)    Physical Exam  Constitutional: She is oriented to person, place, and time. She appears well-developed and well-nourished. She is cooperative.  HENT:  Head: Normocephalic and atraumatic.  Eyes: EOM are normal.  Neck: Normal range of motion.  Cardiovascular: Regular rhythm and normal heart sounds. Tachycardia present. Exam reveals no gallop and no friction rub.  No murmur heard. Pulmonary/Chest: Effort normal and breath sounds normal. No tachypnea. No respiratory distress. She has no decreased breath sounds. She has no wheezes. She has no rhonchi. She has no rales. She exhibits no tenderness.  Abdominal: Soft. Bowel sounds are normal.  Musculoskeletal: Normal range of motion. She exhibits no edema.  Neurological: She is alert and oriented to person, place, and time. Coordination normal.  Skin: Skin is warm and dry.  Psychiatric: She has a normal mood and affect. Her behavior is normal. Judgment and thought content normal.  Nursing note and vitals reviewed.     Patient has been counseled extensively about nutrition and exercise as well as the importance of adherence with medications and regular follow-up. The patient was given clear instructions to go to ER or return to medical center if symptoms don't improve, worsen or new problems develop. The patient verbalized understanding.   Follow-up: Return if symptoms worsen or fail to improve.   Claiborne RiggZelda W Grafton Warzecha, FNP-BC [facility] [place], [place] [phone]   08/23/2017, 1:20 PM

## 2017-08-23 NOTE — Telephone Encounter (Signed)
Patient called requesting pregnancy results. Patient was informed blood test pregnancy testing was not yet in. Patient then asked for urine testing results and the patient was informed and verbalized understanding.

## 2017-08-23 NOTE — Patient Instructions (Addendum)
Human Chorionic Gonadotropin Test Human chorionic gonadotropin (hCG) is a hormone produced during pregnancy by the cells that form the placenta. The placenta is the organ that grows inside your womb (uterus) to nourish a developing baby. When you are pregnant, hCG starts to appear in your blood about 11 days after conception. It continues to go up for the first 8-11 weeks of pregnancy. Your hCG level can be measured with several different types of tests. You may have:  A urine test. ? hCG is eliminated from your body by your kidneys, so a urine test is one way to check for this hormone. ? A urine test only shows whether there is hCG in your urine. It does not measure how much. ? You may have a urine test to find out whether you are pregnant. ? A home pregnancy test detects whether there is hCG in your urine.  A qualitative blood test. ? Like the urine test, this blood test only shows whether there is hCG in your blood. It does not measure how much. ? You may have this type of blood test to find out whether you are pregnant.  A quantitative blood test. ? This type of blood test measures the amount of hCG in your blood. ? You may have this type of test to diagnose an abnormal pregnancy or determine whether you are at risk of, or have had, a failed pregnancy (miscarriage).  How do I prepare for this test? For the urine test:  Limit your fluid intake before the urine test as directed by your health care provider.  Collect the sample the first time you urinate in the morning.  Let your health care provider know if you have blood in your urine. This may interfere with the test result.  Some medicines may interfere with the urine and blood tests. Let your health care provider know about all the medicines you are taking. No additional preparation is required for the blood test. What do the results mean? It is your responsibility to obtain your test results. Ask the lab or department performing  the test when and how you will get your results. Talk to your health care provider if you have any questions about your test results. The results of the hCG urine test and the qualitative hCG blood test are either positive or negative. The results of the quantitative hCG blood test are reported as a number. hCG is measured in international units per liter (IU/L). Meaning of Negative Test Results A negative result on a urine or qualitative blood test could mean that you are not pregnant. It could also mean the test was done too early to detect hCG. If you still have other signs of pregnancy, the test should be repeated. Meaning of Positive Test Results A positive result on the urine or qualitative blood tests means you are most likely pregnant. Your health care provider may confirm your pregnancy with an imaging study of the inside of your uterus at 5-6 weeks (ultrasound). Range of Normal Values Ranges for normal values for the quantitative hCG blood test may vary among different labs and hospitals. You should always check with your health care provider after having lab work or other tests done to discuss whether your values are considered within normal limits.  Less than 5 IU/L means it is most likely you are not pregnant.  Greater than 25 IU/L means it is most likely you are pregnant.  Meaning of Results Outside Normal Value Ranges If your hCG  level on the quantitative test is not what would be expected, you may have the test again. It may also be important for your health care provider to know whether your hCG level goes up or down over time. Common causes of results outside the normal range include: °· Being pregnant with twins (hCG level is higher than expected). °· Having an ectopic pregnancy (hCG rises more slowly than expected). °· Miscarriage (hCG level falls). °· Abnormal growths in the womb (hCG level is higher than expected). ° °Talk with your health care provider to discuss your results,  treatment options, and if necessary, the need for more tests. Talk with your health care provider if you have any questions about your results. °This information is not intended to replace advice given to you by your health care provider. Make sure you discuss any questions you have with your health care provider. °Document Released: 06/26/2004 Document Revised: 01/29/2016 Document Reviewed: 08/29/2013 °Elsevier Interactive Patient Education © 2018 Elsevier Inc. ° °Pregnancy Test Information °What is a pregnancy test? °A pregnancy test is used to detect the presence of human chorionic gonadotropin (hCG) in a sample of your urine or blood. hCG is a hormone produced by the cells of the placenta. The placenta is the organ that forms to nourish and support a developing baby. °This test requires a sample of either blood or urine. A pregnancy test determines whether you are pregnant or not. °How are pregnancy tests done? °Pregnancy tests are done using a home pregnancy test or having a blood or urine test done at your health care provider's office. °Home pregnancy tests require a urine sample. °· Most kits use a plastic testing device with a strip of paper that indicates whether there is hCG in your urine. °· Follow the test instructions very carefully. °· After you urinate on the test stick, markings will appear to let you know whether you are pregnant. °· For best results, use your first urine of the morning. That is when the concentration of hCG is highest. ° °Having a blood test to check for pregnancy requires a sample of blood drawn from a vein in your hand or arm. Your health care provider will send your sample to a lab for testing. Results of a pregnancy test will be positive or negative. °Is one type of pregnancy test better than another? °In some cases, a blood test will return a positive result even if a urine test was negative because blood tests are more sensitive. This means blood tests can detect hCG earlier  than home pregnancy tests. °How accurate are home pregnancy tests? °Both types of pregnancy tests are very accurate. °· A blood test is about 98% accurate. °· When you are far enough along in your pregnancy and when used correctly, home pregnancy tests are equally accurate. ° °Can anything interfere with home pregnancy test results °It is possible for certain conditions to cause an inaccurate test result (false positive or false negative). °· A false positive is a positive test result when you are not pregnant. This can happen if you: °? Are taking certain medicines, including anticonvulsants or tranquilizers. °? Have certain proteins in your blood. °· A false negative is a negative test result when you are pregnant. This can happen if you: °? Took the test before there was enough hCG to detect. A pregnancy test will not be positive in most women until 3-4 weeks after conception. °? Drank a lot of liquid before the test. Diluted urine samples can sometimes   give an inaccurate result. °? Take certain medicines, such as water pills (diuretics) or some antihistamines. ° °What should I do if I have a positive pregnancy test? °If you have a positive pregnancy test, schedule an appointment with your health care provider. You might need additional testing to confirm the pregnancy. In the meantime, begin taking a prenatal vitamin, stop smoking, stop drinking alcohol, and do not use street drugs. °Talk to your health care provider about how to take care of yourself during your pregnancy. Ask about what to expect from the care you will need throughout pregnancy (prenatal care). °This information is not intended to replace advice given to you by your health care provider. Make sure you discuss any questions you have with your health care provider. °Document Released: 05/28/2003 Document Revised: 04/21/2016 Document Reviewed: 09/19/2013 °Elsevier Interactive Patient Education © 2018 Elsevier Inc. ° °

## 2017-08-24 LAB — HCG, SERUM, QUALITATIVE: hCG,Beta Subunit,Qual,Serum: NEGATIVE m[IU]/mL (ref <6)

## 2017-08-24 NOTE — Telephone Encounter (Signed)
CMA called patient to inform on results and PCP advising.  Patient understood.

## 2017-08-24 NOTE — Telephone Encounter (Signed)
-----   Message from Claiborne RiggZelda W Fleming, NP sent at 08/24/2017  9:36 AM EDT ----- Pregnancy test is normal. Please let me know if you would like to make an appointment to discuss birth control options

## 2018-10-27 ENCOUNTER — Telehealth: Payer: Self-pay | Admitting: *Deleted

## 2018-10-27 ENCOUNTER — Ambulatory Visit: Payer: Self-pay | Attending: Family Medicine | Admitting: Family Medicine

## 2018-10-27 ENCOUNTER — Other Ambulatory Visit: Payer: Self-pay

## 2018-10-27 NOTE — Progress Notes (Unsigned)
   Virtual Visit via Telephone Note  I connected with@ on 10/27/18 at  3:30 PM EDT by telephone and verified that I am speaking with the correct person using two identifiers.   I discussed the limitations, risks, security and privacy concerns of performing an evaluation and management service by telephone and the availability of in person appointments. I also discussed with the patient that there may be a patient responsible charge related to this service. The patient expressed understanding and agreed to proceed.  Patient Location: *** Provider Location: *** Others participating in call: ***   History of Present Illness:   No past medical history on file.  Past Surgical History:  Procedure Laterality Date  . None      Family History  Problem Relation Age of Onset  . Diabetes Mother   . Diabetes Maternal Grandmother     Social History   Tobacco Use  . Smoking status: Never Smoker  . Smokeless tobacco: Never Used  Substance Use Topics  . Alcohol use: No  . Drug use: Yes    Frequency: 7.0 times per week    Types: Marijuana     No Known Allergies     Observations/Objective: No vital signs or physical exam conducted as visit was done via telephone  Assessment and Plan:   Follow Up Instructions:    I discussed the assessment and treatment plan with the patient. The patient was provided an opportunity to ask questions and all were answered. The patient agreed with the plan and demonstrated an understanding of the instructions.   The patient was advised to call back or seek an in-person evaluation if the symptoms worsen or if the condition fails to improve as anticipated.  I provided *** minutes of non-face-to-face time during this encounter.   Cain Saupe, MD

## 2018-10-27 NOTE — Telephone Encounter (Signed)
Called number on file and a female picked up stating patient no longer live with him and he don't know her new number.

## 2019-03-20 ENCOUNTER — Other Ambulatory Visit: Payer: Self-pay

## 2019-03-20 ENCOUNTER — Ambulatory Visit: Payer: Self-pay | Attending: Nurse Practitioner | Admitting: Nurse Practitioner

## 2019-03-20 ENCOUNTER — Encounter: Payer: Self-pay | Admitting: Nurse Practitioner

## 2019-03-20 VITALS — BP 100/68 | HR 94 | Temp 98.9°F | Ht 62.0 in | Wt 86.0 lb

## 2019-03-20 DIAGNOSIS — Z124 Encounter for screening for malignant neoplasm of cervix: Secondary | ICD-10-CM

## 2019-03-20 NOTE — Patient Instructions (Signed)
Cancer Screening for Women A cancer screening is a test or exam that checks for cancer. Your health care provider will recommend specific cancer screenings based on your age, personal history, and family history of cancer. Work with your health care provider to create a cancer screening schedule that protects your health. Why is cancer screening done? Cancer screening is done to look for cancer in the very early stages, before it spreads and becomes harder to treat and before you would start to notice symptoms. Finding cancer early improves the chances of successful treatment. It may save your life. Who should be screened for cancer? All women should be screened for certain cancers, including breast cancer, cervical cancer, and skin cancer. Your health care provider may recommend screenings for other types of cancer if:  You had cancer before.  You have a family member with cancer.  You have abnormal genes that could increase the risk of cancer.  You have risk factors for certain cancers, such as smoking. When you should be screened for cancer depends on:  Your age.  Your medical history and your family's medical history.  Certain lifestyle factors, such as smoking.  Environmental exposure, such as to asbestos. What are some common cancer screenings? Breast cancer Breast cancer screening is done with a test that takes images of breast tissue (mammogram). Here are some screening guidelines:  When you are age 40-44, you will be given the choice to start having mammograms.  When you are age 45-54, you should have a mammogram every year.  You may start having mammograms before age 45 if you have risk factors for breast cancer, such as having an immediate family member with breast cancer.  When you are age 55 or older, you should have a mammogram every 1-2 years for as long as you are in good health and have a life expectancy of 10 years or more.  It is important to know what your  breasts look and feel like so you can report any changes to your health care provider.  Cervical cancer Cervical cancer screening is done with a Pap test. This testchecks for abnormalities, including the virus that causes cervical cancer (human papillomavirus, or HPV). To perform the test, a health care provider takes a swab of cervical cells during a pelvic exam. Screening for cervical cancer with a Pap test should start at age 21. Here are some screening guidelines:  When you are age 21-29, you should have a Pap test every 3 years.  When you are age 30-65, you should have a Pap test and HPV test every 5 years or have a Pap test every 3 years.  You may be screened for cervical cancer more often if you have risk factors for cervical cancer.  If your Pap tests are abnormal, you may have an HPV test.  If you have had the HPV vaccine, you will still be screened for cervical cancer and follow normal screening recommendations. You do not need to be screened for cervical cancer if any of the following apply to you:  You are older than age 65 and you have not had a serious cervical precancer or cancer in the last 20 years.  Your cervix and uterus have been removed and you have never had cervical cancer or precancerous cells. Endometrial cancer There is no standard screening test for endometrial cancer, but the cancer can be detected with:  A test of a sample of tissue taken from the lining of the uterus (endometrial tissue   biopsy).  A vaginal ultrasound.  Pap tests. If you are at increased risk for endometrial cancer, you may need to have these tests more often than normal. You are at increased risk if:  You have a family history of ovarian, uterine, or colon cancer.  You are taking tamoxifen, a drug that is used to treat breast cancer.  You have certain types of colon cancer. If you have reached menopause, it is especially important to talk with your health care provider about any vaginal  bleeding or spotting. Screening for endometrial cancer is not recommended for women who do not have symptoms of the cancer, such as vaginal bleeding. Colorectal cancer  All adults should have screening for colorectal cancer starting at age 50 and continuing until age 75. Your health care provider may recommend screening at age 45. You will have tests every 1-10 years, depending on your results and the type of screening test. If you have a family history of colon or rectal cancer or other risk factors, you may need to start having screenings earlier. Talk with your health care provider about which screening test is right for you and how often you should be screened. Colorectal cancer screening looks for cancer or for growths called polyps that often form before cancer starts. Tests to look for cancer or polyps include:  Colonoscopy or flexible sigmoidoscopy. For these procedures, a flexible tube with a small camera is inserted into the rectum.  CT colonography. This test uses X-rays and a contrast dye to check the colon for polyps. If a polyp is found, you may need to have a colonoscopy so the polyp can be located and removed. Tests to look for cancer in the stool (feces) include:  Guaiac-based fecal occult blood test (FOBT). This test detects blood in stool. It can be done at home with a kit.  Fecal immunochemical test (FIT). This test detects blood in stool. For this test, you will need to collect stool samples at home.  Stool DNA test. This test looks for blood in stool and any changes in DNA that can lead to colon cancer. For this test, you will need to collect a stool sample at home and send it to a lab.  Skin cancer Skin cancer screening is done by checking the skin for unusual moles or spots and any changes in existing moles. Your health care provider should check your skin for signs of skin cancer at every physical exam. You should check your skin every month and tell your health care  provider right away if anything looks unusual. Women with a higher-than-normal risk for skin cancer may want to see a skin specialist (dermatologist) for an annual body check. Lung cancer Lung cancer screening is done with a CT scan that looks for abnormal cells in the lungs. Discuss lung cancer screening with your health care provider if you are 55-74 years old and if any of the following apply to you:  You currently smoke.  You used to smoke heavily.  You have a smoking history of 1 pack a day for 30 years or 2 packs a day for 15 years.  You have quit smoking within the past 15 years. If you smoke heavily or if you used to smoke, you may need to be screened every year. Where to find more information  National Cancer Institute: https://www.cancer.gov/about-cancer/screening  Centers for Disease Control and Prevention: https://www.cdc.gov/cancer/dcpc/prevention/screening.htm  Department of Health and Human Services: https://www.womenshealth.gov/screening-tests-and-vaccines/screening-tests-for-women Contact a health care provider if:  You have   concerns about any signs or symptoms of cancer, such as: ? Moles that have an unusual shape or color. ? Changes in existing moles. ? A sore on your skin that does not heal. ? Blood in your stool. ? Fatigue that does not go away. ? Frequent pain or cramping in your abdomen. ? Coughing, or coughing up blood. ? Losing weight without trying. ? Lumps or other changes in your breasts. ? Vaginal bleeding, spotting, or changes in your periods. Summary  Be aware of and watch for signs and symptoms of cancer, especially symptoms of breast cancer, cervical cancer, endometrial cancer, colorectal cancer, skin cancer, and lung cancer.  Early detection of cancer with cancer screening may save your life.  Talk with your health care provider about your specific cancer risks.  Work together with your health care provider to create a cancer screening plan  that is right for you. This information is not intended to replace advice given to you by your health care provider. Make sure you discuss any questions you have with your health care provider. Document Released: 02/20/2016 Document Revised: 09/14/2018 Document Reviewed: 02/20/2016 Elsevier Patient Education  2020 Elsevier Inc.  

## 2019-03-20 NOTE — Progress Notes (Signed)
Assessment & Plan:  Julie Nguyen was seen today for gynecologic exam.  Diagnoses and all orders for this visit:  Encounter for Papanicolaou smear for cervical cancer screening -     Cytology - PAP -     Cervicovaginal ancillary only    Patient has been counseled on age-appropriate routine health concerns for screening and prevention. These are reviewed and up-to-date. Referrals have been placed accordingly. Immunizations are up-to-date or declined.    Subjective:   Chief Complaint  Patient presents with  . Gynecologic Exam   HPI Julie Nguyen 28 y.o. female presents to office today for PAP smear.   Review of Systems  Constitutional: Negative.  Negative for chills, fever, malaise/fatigue and weight loss.  Respiratory: Negative.  Negative for cough, shortness of breath and wheezing.   Cardiovascular: Negative.  Negative for chest pain, orthopnea and leg swelling.  Gastrointestinal: Negative for abdominal pain.  Genitourinary: Negative.  Negative for flank pain.  Skin: Negative.  Negative for rash.  Psychiatric/Behavioral: Negative for suicidal ideas.    History reviewed. No pertinent past medical history.  Past Surgical History:  Procedure Laterality Date  . None      Family History  Problem Relation Age of Onset  . Diabetes Mother   . Diabetes Maternal Grandmother     Social History Reviewed with no changes to be made today.   No outpatient medications prior to visit.   No facility-administered medications prior to visit.     No Known Allergies     Objective:    BP 100/68 (BP Location: Left Arm, Patient Position: Sitting, Cuff Size: Normal)   Pulse 94   Temp 98.9 F (37.2 C) (Oral)   Ht 5\' 2"  (1.575 m)   Wt 86 lb (39 kg)   LMP 02/27/2019   SpO2 100%   BMI 15.73 kg/m  Wt Readings from Last 3 Encounters:  03/20/19 86 lb (39 kg)  08/23/17 81 lb (36.7 kg)  02/17/17 84 lb 9.6 oz (38.4 kg)    Physical Exam Constitutional:      Appearance: She  is well-developed.  HENT:     Head: Normocephalic.  Cardiovascular:     Rate and Rhythm: Normal rate and regular rhythm.     Heart sounds: Normal heart sounds.  Pulmonary:     Effort: Pulmonary effort is normal.     Breath sounds: Normal breath sounds.  Abdominal:     General: Bowel sounds are normal.     Palpations: Abdomen is soft.     Hernia: There is no hernia in the left inguinal area.  Genitourinary:    Labia:        Right: No rash, tenderness, lesion or injury.        Left: No rash, tenderness, lesion or injury.      Vagina: Normal. No signs of injury and foreign body. No vaginal discharge, erythema, tenderness or bleeding.     Cervix: No cervical motion tenderness or friability.     Uterus: Not deviated and not enlarged.      Adnexa:        Right: No mass, tenderness or fullness.         Left: No mass, tenderness or fullness.       Rectum: Normal. No external hemorrhoid.  Lymphadenopathy:     Lower Body: No right inguinal adenopathy. No left inguinal adenopathy.  Skin:    General: Skin is warm and dry.  Neurological:     Mental Status: She  is alert and oriented to person, place, and time.  Psychiatric:        Behavior: Behavior normal.        Thought Content: Thought content normal.        Judgment: Judgment normal.          Patient has been counseled extensively about nutrition and exercise as well as the importance of adherence with medications and regular follow-up. The patient was given clear instructions to go to ER or return to medical center if symptoms don't improve, worsen or new problems develop. The patient verbalized understanding.   Follow-up: Return if symptoms worsen or fail to improve.   Claiborne Rigg, FNP-BC [facility] [place], [place] [phone]   03/20/2019, 2:44 PM

## 2019-03-24 ENCOUNTER — Other Ambulatory Visit: Payer: Self-pay | Admitting: Nurse Practitioner

## 2019-03-24 LAB — CERVICOVAGINAL ANCILLARY ONLY
Bacterial Vaginitis (gardnerella): POSITIVE — AB
Candida Glabrata: NEGATIVE
Candida Vaginitis: POSITIVE — AB
Chlamydia: NEGATIVE
Comment: NEGATIVE
Comment: NEGATIVE
Comment: NEGATIVE
Comment: NEGATIVE
Comment: NEGATIVE
Comment: NORMAL
Neisseria Gonorrhea: NEGATIVE
Trichomonas: NEGATIVE

## 2019-03-24 LAB — CYTOLOGY - PAP
Comment: NEGATIVE
Diagnosis: NEGATIVE
High risk HPV: NEGATIVE

## 2019-03-24 MED ORDER — FLUCONAZOLE 150 MG PO TABS: 150.0000 mg | ORAL_TABLET | Freq: Once | ORAL | 1 refills | Status: AC

## 2019-03-24 MED ORDER — METRONIDAZOLE 500 MG PO TABS: 500.0000 mg | ORAL_TABLET | Freq: Two times a day (BID) | ORAL | 0 refills | Status: AC

## 2019-04-04 ENCOUNTER — Telehealth: Payer: Self-pay | Admitting: Nurse Practitioner

## 2019-04-04 NOTE — Telephone Encounter (Signed)
Patient called for labs and changed the number that is in the system

## 2019-04-05 NOTE — Telephone Encounter (Signed)
Spoke to patient and inform on lab results.  Pt. Understood.  

## 2019-11-29 ENCOUNTER — Other Ambulatory Visit: Payer: Self-pay

## 2019-11-29 ENCOUNTER — Emergency Department (HOSPITAL_COMMUNITY): Payer: Self-pay

## 2019-11-29 ENCOUNTER — Emergency Department (HOSPITAL_COMMUNITY)
Admission: EM | Admit: 2019-11-29 | Discharge: 2019-11-29 | Disposition: A | Discharge status: Home / Self Care | Payer: Medicaid Other | Attending: Emergency Medicine | Admitting: Emergency Medicine

## 2019-11-29 ENCOUNTER — Encounter (HOSPITAL_COMMUNITY): Payer: Self-pay | Admitting: Emergency Medicine

## 2019-11-29 DIAGNOSIS — Y939 Activity, unspecified: Secondary | ICD-10-CM | POA: Insufficient documentation

## 2019-11-29 DIAGNOSIS — Y999 Unspecified external cause status: Secondary | ICD-10-CM | POA: Insufficient documentation

## 2019-11-29 DIAGNOSIS — F1290 Cannabis use, unspecified, uncomplicated: Secondary | ICD-10-CM | POA: Insufficient documentation

## 2019-11-29 DIAGNOSIS — M25562 Pain in left knee: Secondary | ICD-10-CM | POA: Insufficient documentation

## 2019-11-29 DIAGNOSIS — M25512 Pain in left shoulder: Secondary | ICD-10-CM | POA: Insufficient documentation

## 2019-11-29 DIAGNOSIS — Y92481 Parking lot as the place of occurrence of the external cause: Secondary | ICD-10-CM | POA: Insufficient documentation

## 2019-11-29 MED ORDER — KETOROLAC TROMETHAMINE 60 MG/2ML IM SOLN
60.0000 mg | Freq: Once | INTRAMUSCULAR | Status: DC
  Filled 2019-11-29: qty 2

## 2019-11-29 NOTE — Discharge Instructions (Addendum)
Take NSAIDs or Tylenol as needed for the next week. Take this medicine with food. Use a heating pad for sore muscles - use for 20 minutes several times a day Try gentle range of motion exercises Return for worsening symptoms  
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
## 2021-02-21 ENCOUNTER — Ambulatory Visit (INDEPENDENT_AMBULATORY_CARE_PROVIDER_SITE_OTHER): Payer: No Payment, Other | Admitting: Clinical

## 2021-02-21 ENCOUNTER — Other Ambulatory Visit: Payer: Self-pay

## 2021-02-21 DIAGNOSIS — F331 Major depressive disorder, recurrent, moderate: Secondary | ICD-10-CM | POA: Diagnosis not present
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
## 2021-04-14 ENCOUNTER — Ambulatory Visit (INDEPENDENT_AMBULATORY_CARE_PROVIDER_SITE_OTHER): Payer: No Payment, Other | Admitting: Clinical

## 2021-04-14 ENCOUNTER — Other Ambulatory Visit: Payer: Self-pay

## 2021-04-14 ENCOUNTER — Ambulatory Visit (HOSPITAL_COMMUNITY): Payer: Medicaid Other | Admitting: Clinical

## 2021-04-14 DIAGNOSIS — F331 Major depressive disorder, recurrent, moderate: Secondary | ICD-10-CM
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
## 2021-05-29 ENCOUNTER — Ambulatory Visit (INDEPENDENT_AMBULATORY_CARE_PROVIDER_SITE_OTHER): Payer: No Payment, Other | Admitting: Clinical

## 2021-05-29 DIAGNOSIS — F331 Major depressive disorder, recurrent, moderate: Secondary | ICD-10-CM

## 2021-05-29 NOTE — Progress Notes (Signed)
° °  THERAPIST PROGRESS NOTE Virtual Visit via Video Note  I connected with Julie Nguyen on 05/29/21 at  8:00 AM EST by a video enabled telemedicine application and verified that I am speaking with the correct person using two identifiers.  Location: Patient: work Provider: office   I discussed the limitations of evaluation and management by telemedicine and the availability of in person appointments. The patient expressed understanding and agreed to proceed.   Follow Up Instructions: I discussed the assessment and treatment plan with the patient. The patient was provided an opportunity to ask questions and all were answered. The patient agreed with the plan and demonstrated an understanding of the instructions.   The patient was advised to call back or seek an in-person evaluation if the symptoms worsen or if the condition fails to improve as anticipated.   Session Time: 16 minutes  Participation Level: Active  Behavioral Response: CasualAlertEuthymic  Type of Therapy: Individual Therapy  Treatment Goals addressed: Coping  Interventions: CBT and Supportive  Summary:  Julie Nguyen is a 30 y.o. female who presents for the scheduled session oriented times five, appropriately dressed, and friendly.  Client denied hallucinations or delusions. Client reported on today she is doing well. Client reported since she was last seen she has moved into her own apartment. 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 Client reported after calculating her expenses she did not have much left over from her first job so she wanted to obtain extra income. Client reported is figuring out ways to keep her organized with her financial responsibilities. Client reported she is doing good and hope things stay that way.       Suicidal/Homicidal: Nowithout intent/plan  Therapist Response:  Therapist began the appointment asking the client how she  has been doing since last seen. Therapist used CBT to utilize active listening and positive emotional support towards her thoughts and feelings. Therapist used CBT to engage the client to ask her to identify positive changes and/or challenges that have occurred since she was last seen. Therapist used CBT to engage the client to acknowledge goals that she has accomplished. Therapist used CBT to engage the client to identify her ability to problem solve challenges. Therapist assigned the client homework to research clients that help her to organize bills each month. Client was scheduled for next appointment.    Plan: Return again in 5 weeks.  Diagnosis: Major depressive disorder, recurrent episode, moderate with anxious distress   Neena Rhymes Shadavia Dampier, LCSW 05/29/2021

## 2021-08-04 ENCOUNTER — Ambulatory Visit (HOSPITAL_COMMUNITY): Payer: No Payment, Other | Admitting: Clinical

## 2021-08-04 ENCOUNTER — Encounter (HOSPITAL_COMMUNITY): Payer: Self-pay

## 2021-08-16 ENCOUNTER — Encounter (HOSPITAL_COMMUNITY): Payer: Self-pay

## 2021-08-16 ENCOUNTER — Emergency Department (HOSPITAL_COMMUNITY)
Admission: EM | Admit: 2021-08-16 | Discharge: 2021-08-16 | Disposition: A | Discharge status: Home / Self Care | Payer: No Typology Code available for payment source | Attending: Emergency Medicine | Admitting: Emergency Medicine

## 2021-08-16 ENCOUNTER — Emergency Department (HOSPITAL_COMMUNITY): Payer: No Typology Code available for payment source

## 2021-08-16 DIAGNOSIS — S069X9A Unspecified intracranial injury with loss of consciousness of unspecified duration, initial encounter: Secondary | ICD-10-CM

## 2021-08-16 DIAGNOSIS — S0083XA Contusion of other part of head, initial encounter: Secondary | ICD-10-CM | POA: Insufficient documentation

## 2021-08-16 DIAGNOSIS — Y92410 Unspecified street and highway as the place of occurrence of the external cause: Secondary | ICD-10-CM | POA: Insufficient documentation

## 2021-08-16 MED ORDER — NAPROXEN 500 MG PO TABS: 500.0000 mg | ORAL_TABLET | Freq: Two times a day (BID) | ORAL | 0 refills | Status: AC

## 2021-08-16 MED ORDER — METHOCARBAMOL 500 MG PO TABS: 500.0000 mg | ORAL_TABLET | Freq: Three times a day (TID) | ORAL | 0 refills | Status: AC | PRN
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
## 2021-08-16 NOTE — Discharge Instructions (Addendum)
Return to the emergency department immediately if you develop any of the following symptoms: ?You have numbness, tingling, or weakness in the arms or legs. ?You develop severe headaches not relieved with medicine. ?You have severe neck pain, especially tenderness in the middle of the back of your neck. ?You have changes in bowel or bladder control. ?There is increasing pain in any area of the body. ?You have shortness of breath, light-headedness, dizziness, or fainting. ?You have chest pain. ?You feel sick to your stomach (nauseous), throw up (vomit), or sweat. ?You have increasing abdominal discomfort. ?There is blood in your urine, stool, or vomit. ?You have pain in your shoulder (shoulder strap areas). ?You feel your symptoms are getting worse. ? ?Get help right away if: ?You have: ?A severe headache that is not helped by medicine. ?Trouble walking or weakness in your arms and legs. ?Clear or bloody fluid coming from your nose or ears. ?Changes in your vision. ?A seizure. ?Increased confusion or irritability. ?Your symptoms get worse. ?You are sleepier than normal and have trouble staying awake. ?You lose your balance. ?Your pupils change size. ?Your speech is slurred. ?Your dizziness gets worse. ?You vomit. ?

## 2021-08-18 ENCOUNTER — Encounter (HOSPITAL_COMMUNITY): Payer: Self-pay

## 2021-08-18 ENCOUNTER — Ambulatory Visit (HOSPITAL_COMMUNITY): Payer: No Payment, Other | Admitting: Clinical
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
